# Patient Record
Sex: Male | Born: 1985 | Race: White | Hispanic: No | Marital: Married | Smoking: Never smoker
Health system: Southern US, Community
[De-identification: ages and names within clinical notes are randomized; demographics above are authoritative.]

## PROBLEM LIST (undated history)

## (undated) HISTORY — PX: ABDOMINAL SURGERY: SHX537

---

## 2017-06-30 ENCOUNTER — Other Ambulatory Visit: Payer: Self-pay

## 2017-06-30 ENCOUNTER — Encounter (HOSPITAL_COMMUNITY): Payer: Self-pay | Admitting: Emergency Medicine

## 2017-06-30 ENCOUNTER — Ambulatory Visit (HOSPITAL_COMMUNITY)
Admission: EM | Admit: 2017-06-30 | Discharge: 2017-06-30 | Disposition: A | Discharge status: Home / Self Care | Payer: 59 | Attending: Family Medicine | Admitting: Family Medicine

## 2017-06-30 DIAGNOSIS — Z202 Contact with and (suspected) exposure to infections with a predominantly sexual mode of transmission: Secondary | ICD-10-CM | POA: Diagnosis not present

## 2017-06-30 DIAGNOSIS — R369 Urethral discharge, unspecified: Secondary | ICD-10-CM | POA: Diagnosis not present

## 2017-06-30 LAB — POCT URINALYSIS DIP (DEVICE)
BILIRUBIN URINE: NEGATIVE
GLUCOSE, UA: NEGATIVE mg/dL
Hgb urine dipstick: NEGATIVE
Ketones, ur: NEGATIVE mg/dL
LEUKOCYTES UA: NEGATIVE
NITRITE: NEGATIVE
Protein, ur: NEGATIVE mg/dL
SPECIFIC GRAVITY, URINE: 1.025 (ref 1.005–1.030)
UROBILINOGEN UA: 0.2 mg/dL (ref 0.0–1.0)
pH: 6.5 (ref 5.0–8.0)

## 2017-06-30 MED ORDER — LIDOCAINE HCL (PF) 1 % IJ SOLN
INTRAMUSCULAR | Status: AC
  Filled 2017-06-30: qty 2

## 2017-06-30 MED ORDER — CEFTRIAXONE SODIUM 250 MG IJ SOLR
250.0000 mg | Freq: Once | INTRAMUSCULAR | Status: AC
  Administered 2017-06-30: 250 mg via INTRAMUSCULAR

## 2017-06-30 MED ORDER — AZITHROMYCIN 250 MG PO TABS
1000.0000 mg | ORAL_TABLET | Freq: Once | ORAL | Status: AC
  Administered 2017-06-30: 1000 mg via ORAL

## 2017-06-30 MED ORDER — CEFTRIAXONE SODIUM 250 MG IJ SOLR
INTRAMUSCULAR | Status: AC
  Filled 2017-06-30: qty 250

## 2017-06-30 MED ORDER — AZITHROMYCIN 250 MG PO TABS
ORAL_TABLET | ORAL | Status: AC
  Filled 2017-06-30: qty 4

## 2017-06-30 NOTE — ED Triage Notes (Addendum)
Treated 2 1/2 weeks ago treated for gc and chlamydia.  Continues to have discharge.  Patient was [place] [facility]   States he received an injection at the center he was treated at and has azithromycin sent to a pharmacy and he did take.  Continues to have pain with urination and discharge

## 2017-06-30 NOTE — ED Provider Notes (Signed)
  [facility]   [id number] 06/30/17 Arrival Time: 1720  ASSESSMENT & PLAN:  1. Penile discharge     Meds ordered this encounter  Medications  . cefTRIAXone (ROCEPHIN) injection 250 mg  . azithromycin (ZITHROMAX) tablet 1,000 mg   Retreat since he continues to have penile discharge. Urine cytology sent. Will notify of any positive results. Instructed to refrain from sexual activity for at least seven days.  Reviewed expectations re: course of current medical issues. Questions answered. Outlined signs and symptoms indicating need for more acute intervention. Patient verbalized understanding. After Visit Summary given.   SUBJECTIVE:  Albert Sanchez is a 32 y.o. male who presents with complaint of penile discharge. Reports being treated for GC/Chlamydia approx 2 weeks ago; tested + for GC. Reports he continues to see a "greenish discharge" in his underwear each morning. Urinary symptoms: occasional slight dysuria. Afebrile. No abdominal or pelvic pain. No n/v. No rashes or lesions. Sexually active with single male partner; wife. OTC treatment: None.  ROS: As per HPI.  OBJECTIVE:  Vitals:   06/30/17 1738  BP: 135/88  Pulse: 83  Resp: 18  Temp: 98.2 F (36.8 C)  TempSrc: Oral  SpO2: 96%     General appearance: alert, cooperative, appears stated age and no distress Throat: lips, mucosa, and tongue normal; teeth and gums normal Back: no CVA tenderness Abdomen: soft, non-tender; bowel sounds normal; no masses or organomegaly; no guarding or rebound tenderness GU: declines Skin: warm and dry Psychological:  Alert and cooperative. Normal mood and affect.  Results for orders placed or performed during the hospital encounter of 06/30/17  POCT urinalysis dip (device)  Result Value Ref Range   Glucose, UA NEGATIVE NEGATIVE mg/dL   Bilirubin Urine NEGATIVE NEGATIVE   Ketones, ur NEGATIVE NEGATIVE mg/dL   Specific Gravity, Urine 1.025 1.005 - 1.030   Hgb urine  dipstick NEGATIVE NEGATIVE   pH 6.5 5.0 - 8.0   Protein, ur NEGATIVE NEGATIVE mg/dL   Urobilinogen, UA 0.2 0.0 - 1.0 mg/dL   Nitrite NEGATIVE NEGATIVE   Leukocytes, UA NEGATIVE NEGATIVE    Labs Reviewed  POCT URINALYSIS DIP (DEVICE)  URINE CYTOLOGY ANCILLARY ONLY    No results found.  No Known Allergies   Family History  Problem Relation Age of Onset  . Healthy Mother    Social History   Socioeconomic History  . Marital status: Married    Spouse name: Not on file  . Number of children: Not on file  . Years of education: Not on file  . Highest education level: Not on file  Social Needs  . Financial resource strain: Not on file  . Food insecurity - worry: Not on file  . Food insecurity - inability: Not on file  . Transportation needs - medical: Not on file  . Transportation needs - non-medical: Not on file  Occupational History  . Not on file  Tobacco Use  . Smoking status: Never Smoker  Substance and Sexual Activity  . Alcohol use: Yes  . Drug use: No  . Sexual activity: Not on file  Other Topics Concern  . Not on file  Social History Narrative  . Not on file          Mardella LaymanHagler, Noriko Macari, MD 06/30/17 1820

## 2017-06-30 NOTE — Discharge Instructions (Signed)

## 2017-06-30 NOTE — ED Notes (Signed)
Call back number verified and updated in EPIC... Adv pt to not have SI until lab results comeback neg.... Also adv pt lab results will be on MyChart; instructions given .... Pt verb understanding.   

## 2017-07-01 LAB — URINE CYTOLOGY ANCILLARY ONLY
CHLAMYDIA, DNA PROBE: NEGATIVE
Neisseria Gonorrhea: NEGATIVE
Trichomonas: NEGATIVE

## 2020-04-23 ENCOUNTER — Other Ambulatory Visit: Payer: Self-pay | Admitting: Nurse Practitioner

## 2020-04-23 ENCOUNTER — Ambulatory Visit
Admission: RE | Admit: 2020-04-23 | Discharge: 2020-04-23 | Disposition: A | Discharge status: Home / Self Care | Payer: No Typology Code available for payment source | Source: Ambulatory Visit | Attending: Nurse Practitioner | Admitting: Nurse Practitioner

## 2020-04-23 DIAGNOSIS — Z021 Encounter for pre-employment examination: Secondary | ICD-10-CM

## 2020-06-17 ENCOUNTER — Inpatient Hospital Stay (HOSPITAL_COMMUNITY)
Admission: EM | Admit: 2020-06-17 | Discharge: 2020-06-18 | DRG: 390 | Disposition: A | Discharge status: Home / Self Care | Payer: 59 | Attending: General Surgery | Admitting: General Surgery

## 2020-06-17 ENCOUNTER — Emergency Department (HOSPITAL_COMMUNITY): Payer: 59

## 2020-06-17 ENCOUNTER — Encounter (HOSPITAL_COMMUNITY): Payer: Self-pay | Admitting: Emergency Medicine

## 2020-06-17 DIAGNOSIS — Z20822 Contact with and (suspected) exposure to covid-19: Secondary | ICD-10-CM | POA: Diagnosis present

## 2020-06-17 DIAGNOSIS — K56609 Unspecified intestinal obstruction, unspecified as to partial versus complete obstruction: Secondary | ICD-10-CM | POA: Diagnosis not present

## 2020-06-17 DIAGNOSIS — Z79899 Other long term (current) drug therapy: Secondary | ICD-10-CM

## 2020-06-17 DIAGNOSIS — G44009 Cluster headache syndrome, unspecified, not intractable: Secondary | ICD-10-CM | POA: Diagnosis present

## 2020-06-17 LAB — COMPREHENSIVE METABOLIC PANEL
ALT: 42 U/L (ref 0–44)
AST: 53 U/L — ABNORMAL HIGH (ref 15–41)
Albumin: 4.2 g/dL (ref 3.5–5.0)
Alkaline Phosphatase: 50 U/L (ref 38–126)
Anion gap: 13 (ref 5–15)
BUN: 19 mg/dL (ref 6–20)
CO2: 24 mmol/L (ref 22–32)
Calcium: 9.6 mg/dL (ref 8.9–10.3)
Chloride: 102 mmol/L (ref 98–111)
Creatinine, Ser: 1.09 mg/dL (ref 0.61–1.24)
GFR, Estimated: >60 mL/min (ref >60)
Glucose, Bld: 95 mg/dL (ref 70–99)
Potassium: 3.9 mmol/L (ref 3.5–5.1)
Sodium: 139 mmol/L (ref 135–145)
Total Bilirubin: 0.9 mg/dL (ref 0.3–1.2)
Total Protein: 7 g/dL (ref 6.5–8.1)

## 2020-06-17 LAB — CBC WITH DIFFERENTIAL/PLATELET
Abs Immature Granulocytes: 0.06 10*3/uL (ref 0.00–0.07)
Basophils Absolute: 0.1 10*3/uL (ref 0.0–0.1)
Basophils Relative: 0 %
Eosinophils Absolute: 0.1 10*3/uL (ref 0.0–0.5)
Eosinophils Relative: 1 %
HCT: 42.9 % (ref 39.0–52.0)
Hemoglobin: 14.9 g/dL (ref 13.0–17.0)
Immature Granulocytes: 1 %
Lymphocytes Relative: 13 %
Lymphs Abs: 1.8 10*3/uL (ref 0.7–4.0)
MCH: 31.2 pg (ref 26.0–34.0)
MCHC: 34.7 g/dL (ref 30.0–36.0)
MCV: 89.9 fL (ref 80.0–100.0)
Monocytes Absolute: 1.1 10*3/uL — ABNORMAL HIGH (ref 0.1–1.0)
Monocytes Relative: 8 %
Neutro Abs: 10.2 10*3/uL — ABNORMAL HIGH (ref 1.7–7.7)
Neutrophils Relative %: 77 %
Platelets: 326 10*3/uL (ref 150–400)
RBC: 4.77 MIL/uL (ref 4.22–5.81)
RDW: 11.9 % (ref 11.5–15.5)
WBC: 13.1 10*3/uL — ABNORMAL HIGH (ref 4.0–10.5)
nRBC: 0 % (ref 0.0–0.2)

## 2020-06-17 LAB — CBG MONITORING, ED: Glucose-Capillary: 92 mg/dL (ref 70–99)

## 2020-06-17 LAB — LIPASE, BLOOD: Lipase: 38 U/L (ref 11–51)

## 2020-06-17 LAB — LACTIC ACID, PLASMA: Lactic Acid, Venous: 1.1 mmol/L (ref 0.5–1.9)

## 2020-06-17 MED ORDER — ONDANSETRON HCL 4 MG/2ML IJ SOLN
4.0000 mg | Freq: Once | INTRAMUSCULAR | Status: AC
  Administered 2020-06-17: 4 mg via INTRAVENOUS
  Filled 2020-06-17: qty 2

## 2020-06-17 MED ORDER — MORPHINE SULFATE (PF) 4 MG/ML IV SOLN
4.0000 mg | Freq: Once | INTRAVENOUS | Status: AC
  Administered 2020-06-17: 4 mg via INTRAVENOUS
  Filled 2020-06-17: qty 1

## 2020-06-17 MED ORDER — DOCUSATE SODIUM 100 MG PO CAPS
100.0000 mg | ORAL_CAPSULE | Freq: Two times a day (BID) | ORAL | Status: DC
  Administered 2020-06-18: 100 mg via ORAL
  Filled 2020-06-17: qty 1

## 2020-06-17 MED ORDER — ACETAMINOPHEN 10 MG/ML IV SOLN
1000.0000 mg | Freq: Four times a day (QID) | INTRAVENOUS | Status: DC
  Administered 2020-06-17 – 2020-06-18 (×3): 1000 mg via INTRAVENOUS
  Filled 2020-06-17 (×5): qty 100

## 2020-06-17 MED ORDER — LACTATED RINGERS IV SOLN: INTRAVENOUS | Status: DC

## 2020-06-17 MED ORDER — ONDANSETRON 4 MG PO TBDP: 4.0000 mg | ORAL_TABLET | Freq: Four times a day (QID) | ORAL | Status: DC | PRN

## 2020-06-17 MED ORDER — KETOROLAC TROMETHAMINE 15 MG/ML IJ SOLN
15.0000 mg | Freq: Four times a day (QID) | INTRAMUSCULAR | Status: DC
  Administered 2020-06-17 – 2020-06-18 (×4): 15 mg via INTRAVENOUS
  Filled 2020-06-17 (×4): qty 1

## 2020-06-17 MED ORDER — METHOCARBAMOL 1000 MG/10ML IJ SOLN
1000.0000 mg | Freq: Three times a day (TID) | INTRAVENOUS | Status: DC
  Administered 2020-06-17 – 2020-06-18 (×2): 1000 mg via INTRAVENOUS
  Filled 2020-06-17 (×2): qty 10

## 2020-06-17 MED ORDER — ENOXAPARIN SODIUM 40 MG/0.4ML ~~LOC~~ SOLN: 40.0000 mg | SUBCUTANEOUS | Status: DC

## 2020-06-17 MED ORDER — FENTANYL CITRATE (PF) 100 MCG/2ML IJ SOLN
50.0000 ug | Freq: Once | INTRAMUSCULAR | Status: AC
  Administered 2020-06-17: 50 ug via INTRAVENOUS
  Filled 2020-06-17: qty 2

## 2020-06-17 MED ORDER — OXYCODONE HCL 5 MG/5ML PO SOLN
5.0000 mg | ORAL | Status: DC | PRN
Start: 2020-06-17 — End: 2020-06-18

## 2020-06-17 MED ORDER — MORPHINE SULFATE (PF) 2 MG/ML IV SOLN
2.0000 mg | INTRAVENOUS | Status: DC | PRN
Start: 2020-06-17 — End: 2020-06-18

## 2020-06-17 MED ORDER — DIATRIZOATE MEGLUMINE & SODIUM 66-10 % PO SOLN
90.0000 mL | Freq: Once | ORAL | Status: AC
  Administered 2020-06-17: 90 mL via NASOGASTRIC
  Filled 2020-06-17 (×2): qty 90

## 2020-06-17 MED ORDER — IOHEXOL 300 MG/ML  SOLN
100.0000 mL | Freq: Once | INTRAMUSCULAR | Status: AC | PRN
  Administered 2020-06-17: 100 mL via INTRAVENOUS

## 2020-06-17 MED ORDER — ONDANSETRON HCL 4 MG/2ML IJ SOLN: 4.0000 mg | Freq: Four times a day (QID) | INTRAMUSCULAR | Status: DC | PRN
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
## 2020-06-17 NOTE — ED Triage Notes (Signed)
Pt here with c/o abd pain along with n/v , pt has hx of abd trauma and has been told he had some adhesions in his abd , pt also had covid about 2 weeks ago
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
## 2020-06-18 ENCOUNTER — Inpatient Hospital Stay (HOSPITAL_COMMUNITY): Payer: 59

## 2020-06-18 ENCOUNTER — Other Ambulatory Visit: Payer: Self-pay

## 2020-06-18 ENCOUNTER — Encounter (HOSPITAL_COMMUNITY): Payer: Self-pay | Admitting: *Deleted

## 2020-06-18 LAB — BASIC METABOLIC PANEL
Anion gap: 12 (ref 5–15)
BUN: 22 mg/dL — ABNORMAL HIGH (ref 6–20)
CO2: 24 mmol/L (ref 22–32)
Calcium: 9 mg/dL (ref 8.9–10.3)
Chloride: 104 mmol/L (ref 98–111)
Creatinine, Ser: 1.03 mg/dL (ref 0.61–1.24)
GFR, Estimated: >60 mL/min (ref >60)
Glucose, Bld: 90 mg/dL (ref 70–99)
Potassium: 3.4 mmol/L — ABNORMAL LOW (ref 3.5–5.1)
Sodium: 140 mmol/L (ref 135–145)

## 2020-06-18 LAB — CBC
HCT: 40.6 % (ref 39.0–52.0)
Hemoglobin: 14.7 g/dL (ref 13.0–17.0)
MCH: 32.2 pg (ref 26.0–34.0)
MCHC: 36.2 g/dL — ABNORMAL HIGH (ref 30.0–36.0)
MCV: 88.8 fL (ref 80.0–100.0)
Platelets: 275 10*3/uL (ref 150–400)
RBC: 4.57 MIL/uL (ref 4.22–5.81)
RDW: 11.9 % (ref 11.5–15.5)
WBC: 9.3 10*3/uL (ref 4.0–10.5)
nRBC: 0 % (ref 0.0–0.2)

## 2020-06-18 LAB — URINALYSIS, ROUTINE W REFLEX MICROSCOPIC
Bacteria, UA: NONE SEEN
Glucose, UA: NEGATIVE mg/dL
Hgb urine dipstick: NEGATIVE
Ketones, ur: 80 mg/dL — AB
Leukocytes,Ua: NEGATIVE
Nitrite: NEGATIVE
Protein, ur: 30 mg/dL — AB
Specific Gravity, Urine: >1.046 — ABNORMAL HIGH (ref 1.005–1.030)
pH: 5 (ref 5.0–8.0)

## 2020-06-18 LAB — HIV ANTIBODY (ROUTINE TESTING W REFLEX): HIV Screen 4th Generation wRfx: NONREACTIVE

## 2020-06-18 MED ORDER — ACETAMINOPHEN 500 MG PO TABS
1000.0000 mg | ORAL_TABLET | Freq: Four times a day (QID) | ORAL | Status: DC
  Administered 2020-06-18: 1000 mg via ORAL
  Filled 2020-06-18: qty 2

## 2020-06-18 MED ORDER — IBUPROFEN 200 MG PO TABS: 200.0000 mg | ORAL_TABLET | Freq: Three times a day (TID) | ORAL | 2 refills | Status: AC | PRN

## 2020-06-18 MED ORDER — METHOCARBAMOL 500 MG PO TABS: 500.0000 mg | ORAL_TABLET | Freq: Four times a day (QID) | ORAL | Status: DC | PRN

## 2020-06-18 MED ORDER — ACETAMINOPHEN 500 MG PO TABS: 1000.0000 mg | ORAL_TABLET | Freq: Four times a day (QID) | ORAL | 0 refills | Status: AC | PRN

## 2020-06-18 MED ORDER — SUMATRIPTAN SUCCINATE 6 MG/0.5ML ~~LOC~~ SOLN
6.0000 mg | Freq: Once | SUBCUTANEOUS | Status: AC
  Administered 2020-06-18: 6 mg via SUBCUTANEOUS
  Filled 2020-06-18: qty 0.5

## 2020-06-18 NOTE — Progress Notes (Signed)
Pt ready for DC after eating his soft diet. Work note provided by Barnetta Chapel, PA.  DC instructions reviewed and given.

## 2020-06-18 NOTE — Progress Notes (Signed)
Pt tolerating clear liquids, advanced to fulls.

## 2020-06-18 NOTE — Progress Notes (Signed)
Subjective: Still with some abdominal pain, but passing gas and having BMs.  No nausea.    ROS: See above, otherwise other systems negative  Objective: Vital signs in last 24 hours: Temp:  [97.7 F (36.5 C)-98.7 F (37.1 C)] 98.1 F (36.7 C) (01/11 0809) Pulse Rate:  [57-98] 57 (01/11 0809) Resp:  [14-18] 18 (01/11 0809) BP: (93-128)/(66-89) 112/66 (01/11 0809) SpO2:  [96 %-98 %] 96 % (01/11 0809) Weight:  [90.7 kg] 90.7 kg (01/10 1034) Last BM Date: 06/18/20  Intake/Output from previous day: 01/10 0701 - 01/11 0700 In: 1379.6 [I.V.:979.8; IV Piggyback:399.8] Out: 1 [Urine:1] Intake/Output this shift: No intake/output data recorded.  PE: Heart: regular Lungs: CTAB Abd: soft, mildly tender in lower abdomen, +BS, ND  Lab Results:  Recent Labs    06/17/20 1059 06/18/20 0459  WBC 13.1* 9.3  HGB 14.9 14.7  HCT 42.9 40.6  PLT 326 275   BMET Recent Labs    06/17/20 1059 06/18/20 0459  NA 139 140  K 3.9 3.4*  CL 102 104  CO2 24 24  GLUCOSE 95 90  BUN 19 22*  CREATININE 1.09 1.03  CALCIUM 9.6 9.0   PT/INR No results for input(s): LABPROT, INR in the last 72 hours. CMP     Component Value Date/Time   NA 140 06/18/2020 0459   K 3.4 (L) 06/18/2020 0459   CL 104 06/18/2020 0459   CO2 24 06/18/2020 0459   GLUCOSE 90 06/18/2020 0459   BUN 22 (H) 06/18/2020 0459   CREATININE 1.03 06/18/2020 0459   CALCIUM 9.0 06/18/2020 0459   PROT 7.0 06/17/2020 1059   ALBUMIN 4.2 06/17/2020 1059   AST 53 (H) 06/17/2020 1059   ALT 42 06/17/2020 1059   ALKPHOS 50 06/17/2020 1059   BILITOT 0.9 06/17/2020 1059   GFRNONAA >60 06/18/2020 0459   Lipase     Component Value Date/Time   LIPASE 38 06/17/2020 1059       Studies/Results: CT ABDOMEN PELVIS W CONTRAST  Result Date: 06/17/2020 CLINICAL DATA:  Abdominal pain with nausea and vomiting. Prior bowel resection 6 years ago previous adhesions. EXAM: CT ABDOMEN AND PELVIS WITH CONTRAST TECHNIQUE:  Multidetector CT imaging of the abdomen and pelvis was performed using the standard protocol following bolus administration of intravenous contrast. CONTRAST:  OMNIPAQUE IOHEXOL 300 MG/ML  SOLN COMPARISON:  06/08/2019 FINDINGS: Lower chest: Unremarkable Hepatobiliary: Probable hepatic steatosis. Gallbladder unremarkable. No biliary dilatation. No significant focal hepatic lesions. Pancreas: Unremarkable Spleen: Unremarkable Adrenals/Urinary Tract: Both adrenal glands appear normal. 4 mm in long axis left kidney upper pole nonobstructive renal calculus. Two nonobstructive right renal calculi measure up to 0.2 cm in diameter. No ureteral or bladder calculus identified. No hydronephrosis or hydroureter. Stomach/Bowel: Borderline dilated central loops of small bowel measuring up to 3.0 cm in diameter, but without a well-defined zone of transition, and only slow tapering into nondilated loops of distal small bowel. No obvious twisted loop or well-defined focal adhesion as a lead point for obstruction. Normal appendix.  No dilation of the large bowel. Vascular/Lymphatic: Unremarkable Reproductive: Unremarkable Other: No supplemental non-categorized findings. Musculoskeletal: Unremarkable IMPRESSION: 1. Borderline dilated central loops of small bowel measuring up to 3.0 cm in diameter, but without a well-defined zone of transition, and only slow tapering into nondilated loops of distal small bowel. No obvious twisted loop or well-defined focal adhesion as a lead point for obstruction. Appearance could well be due to a localized small bowel ileus, correlate  with bowel sounds. If symptoms persist or worsen then follow up imaging may be warranted. 2. Bilateral nonobstructive nephrolithiasis. 3. Probable hepatic steatosis. Electronically Signed   By: Gaylyn Rong M.D.   On: 06/17/2020 17:22   DG Abd Portable 1V-Small Bowel Obstruction Protocol-initial, 8 hr delay  Result Date: 06/18/2020 CLINICAL DATA:   Small-bowel protocol, 8 hour delay EXAM: PORTABLE ABDOMEN - 1 VIEW COMPARISON:  CT 06/17/2020 FINDINGS: Administered high attenuation enteric contrast media has traversed to the level of the rectal vault. No residual distended loops of small bowel are visualized in the abdomen. Some water diluted contrast media projects over the abdomen centrally. No suspicious calcifications. No acute osseous or other soft tissue abnormality. IMPRESSION: 1. Administered high attenuation enteric contrast media has traversed to the level of the rectal vault. 2. No residual distended loops of small bowel. Electronically Signed   By: Kreg Shropshire M.D.   On: 06/18/2020 05:18    Anti-infectives: Anti-infectives (From admission, onward)   None       Assessment/Plan SBO -resolving on films and clinically.  Still with some abdominal pain, which can be expected in resolving SBOs -CLD, ADAT -anticipate DC home tomorrow   FEN - CLD, ADAT, AVFs VTE - Lovenox ID - none   LOS: 1 day    Letha Cape , [facility] 06/18/2020, 10:19 AM Please see Amion for pager number during day hours 7:00am-4:30pm or 7:00am -11:30am on weekends

## 2020-06-18 NOTE — Discharge Instructions (Signed)
Bowel Obstruction A bowel obstruction means that something is blocking the small or large bowel. The bowel is also called the intestine. It is the long tube that connects the stomach to the opening of the butt (anus). When something blocks the bowel, food and fluids cannot pass through like normal. This condition needs to be treated. Treatment depends on the cause of the problem and how bad the problem is. What are the causes? Common causes of this condition include:  Scar tissue (adhesions) from past surgery or from high-energy X-rays (radiation).  Recent surgery in the belly. This affects how food moves in the bowel.  Some diseases, such as: ? Irritation of the lining of the digestive tract (Crohn's disease). ? Irritation of small pouches in the bowel (diverticulitis).  Growths or tumors.  A bulging organ (hernia).  Twisting of the bowel (volvulus).  A foreign body.  Slipping of a part of the bowel into another part (intussusception).   What are the signs or symptoms? Symptoms of this condition include:  Pain in the belly.  Feeling sick to your stomach (nauseous).  Throwing up (vomiting).  Bloating in the belly.  Being unable to pass gas.  Trouble pooping (constipation).  Watery poop (diarrhea).  A lot of belching. How is this diagnosed? This condition may be diagnosed based on:  A physical exam.  Medical history.  Imaging tests, such as X-ray or CT scan.  Blood tests.  Urine tests. How is this treated? Treatment for this condition may include:  Fluids and pain medicines that are given through an IV tube. Your doctor may tell you not to eat or drink if you feel sick to your stomach and are throwing up.  Eating a clear liquid diet for a few days.  Putting a small tube (nasogastric tube) into the stomach. This will help with pain, discomfort, and nausea by removing blocked air and fluids from the stomach.  Surgery. This may be needed if other treatments do  not work. Follow these instructions at home: Medicines  Take over-the-counter and prescription medicines only as told by your doctor.  If you were prescribed an antibiotic medicine, take it as told by your doctor. Do not stop taking the antibiotic even if you start to feel better. General instructions  Follow your diet as told by your doctor. You may need to: ? Only drink clear liquids until you start to get better. ? Avoid solid foods.  Return to your normal activities as told by your doctor. Ask your doctor what activities are safe for you.  Do not sit for a long time without moving. Get up to take short walks every 1-2 hours. This is important. Ask for help if you feel weak or unsteady.  Keep all follow-up visits as told by your doctor. This is important. How is this prevented? After having a bowel obstruction, you may be more likely to have another. You can do some things to stop it from happening again.  If you have a long-term (chronic) disease, contact your doctor if you see changes or problems.  Take steps to prevent or treat trouble pooping. Your doctor may ask that you: ? Drink enough fluid to keep your pee (urine) pale yellow. ? Take over-the-counter or prescription medicines. ? Eat foods that are high in fiber. These include beans, whole grains, and fresh fruits and vegetables. ? Limit foods that are high in fat and sugar. These include fried or sweet foods.  Stay active. Ask your doctor which   exercises are safe for you.  Avoid stress.  Eat three small meals and three small snacks each day.  Work with a food expert (dietitian) to make a meal plan that works for you.  Do not use any products that contain nicotine or tobacco, such as cigarettes and e-cigarettes. If you need help quitting, ask your doctor.   Contact a doctor if:  You have a fever.  You have chills. Get help right away if:  You have pain or cramps that get worse.  You throw up blood.  You are  sick to your stomach.  You cannot stop throwing up.  You cannot drink fluids.  You feel mixed up (confused).  You feel very thirsty (dehydrated).  Your belly gets more bloated.  You feel weak or you pass out (faint). Summary  A bowel obstruction means that something is blocking the small or large bowel.  Treatment may include IV fluids and pain medicine. You may also have a clear liquid diet, a small tube in your stomach, or surgery.  Drink clear liquids and avoid solid foods until you get better. This information is not intended to replace advice given to you by your health care provider. Make sure you discuss any questions you have with your health care provider. Document Revised: 10/06/2017 Document Reviewed: 10/06/2017 Elsevier Patient Education  2021 [employer]  

## 2020-06-18 NOTE — Progress Notes (Signed)
Pt reports relief from Imitrex shot earlier this am.
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
## 2022-04-19 IMAGING — CT CT ABD-PELV W/ CM
2 of 4 series · 16 of 46 positions shown, 18 images · IV contrast (APPLIED)
Comparison: 06/08/2019

CLINICAL DATA: Abdominal pain with nausea and vomiting. Prior bowel
resection 6 years ago previous adhesions.

EXAM:
CT ABDOMEN AND PELVIS WITH CONTRAST
TECHNIQUE: Multidetector CT imaging of the abdomen and pelvis was performed
using the standard protocol following bolus administration of
intravenous contrast.
CONTRAST:  100mL OMNIPAQUE IOHEXOL 300 MG/ML  SOLN

[Series 3: abd/ pelvis 5.0 i30f 2 · axial · 0.83mm/px · z∈[+696,+1176]mm · 13 of 106 slices shown, 15 images]
[im 5/106  soft-tissue]
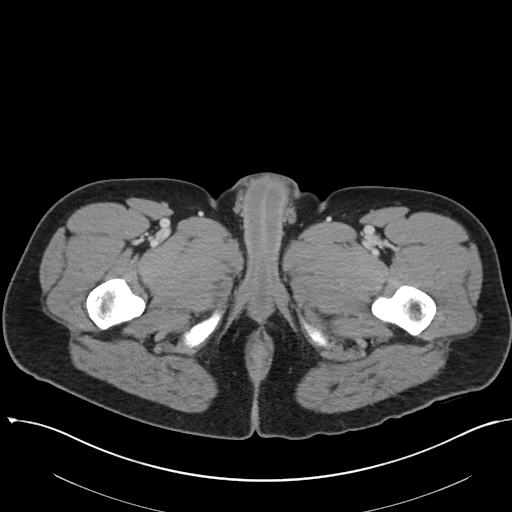
[im 5/106  bone]
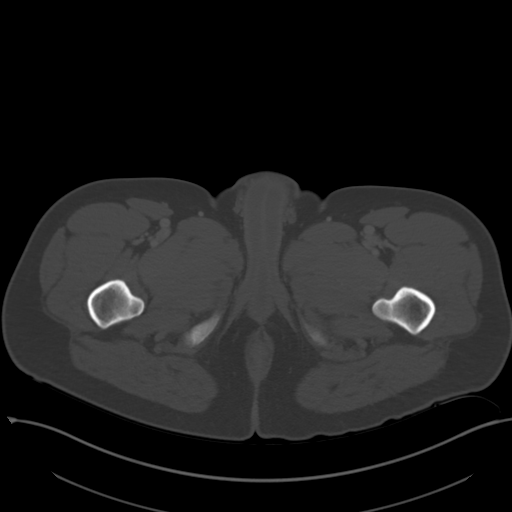
[im 13/106  soft-tissue]
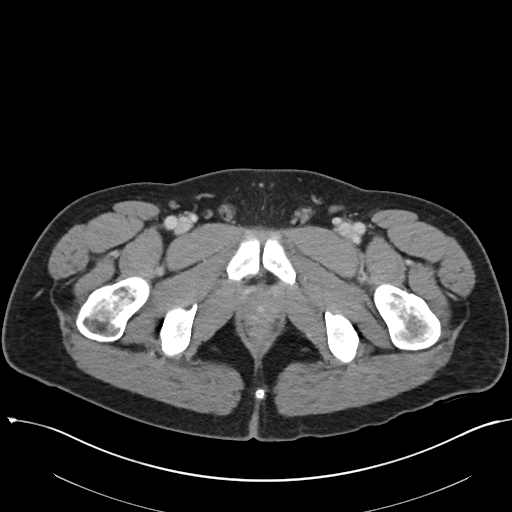
[im 22/106  soft-tissue]
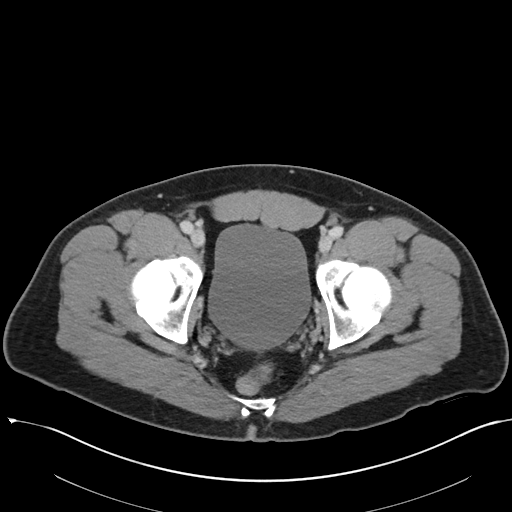
[im 30/106  soft-tissue]
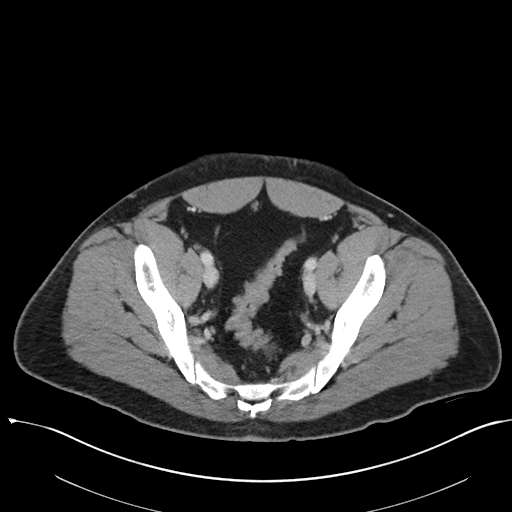
[im 38/106  soft-tissue]
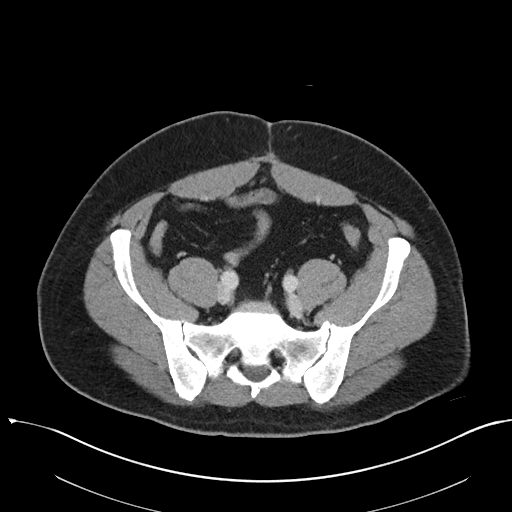
[im 47/106  soft-tissue]
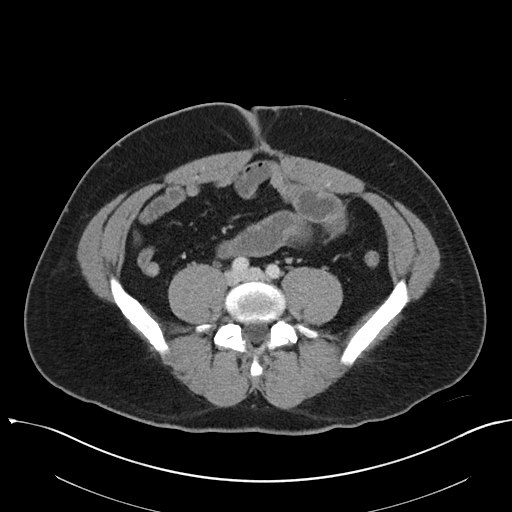
[im 55/106  soft-tissue]
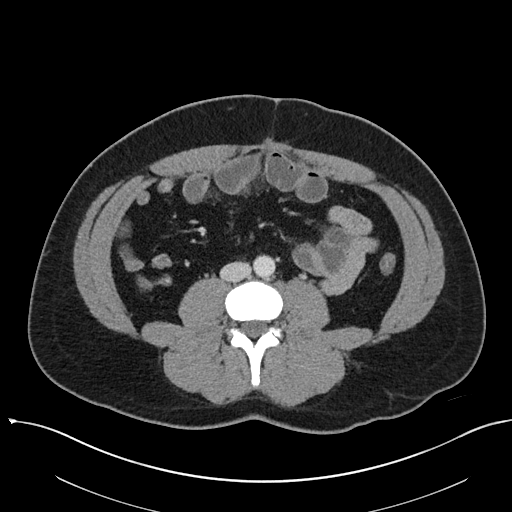
[im 59/106  soft-tissue]
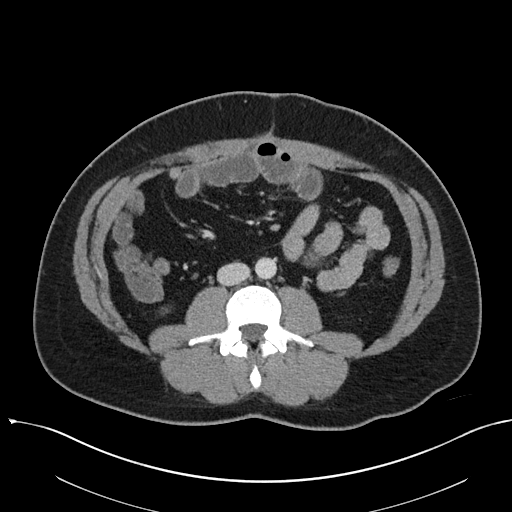
[im 68/106  soft-tissue]
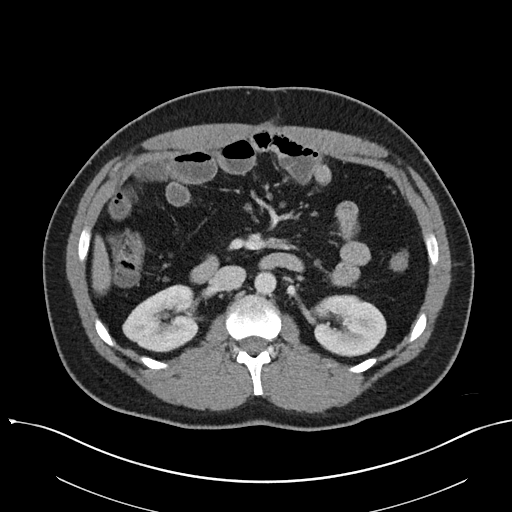
[im 68/106  bone]
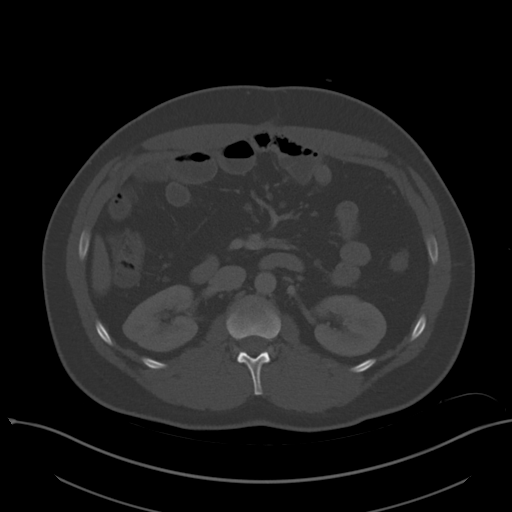
[im 76/106  soft-tissue]
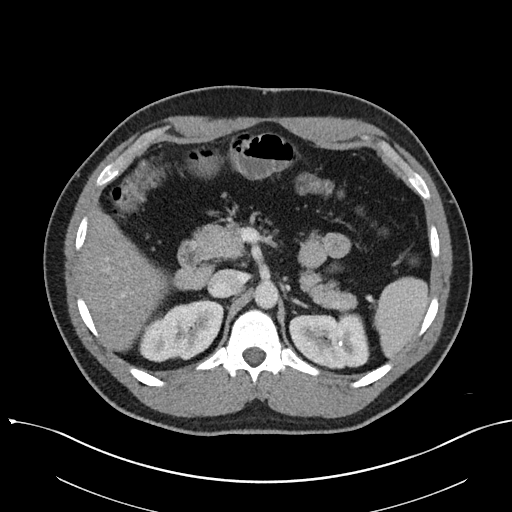
[im 85/106  soft-tissue]
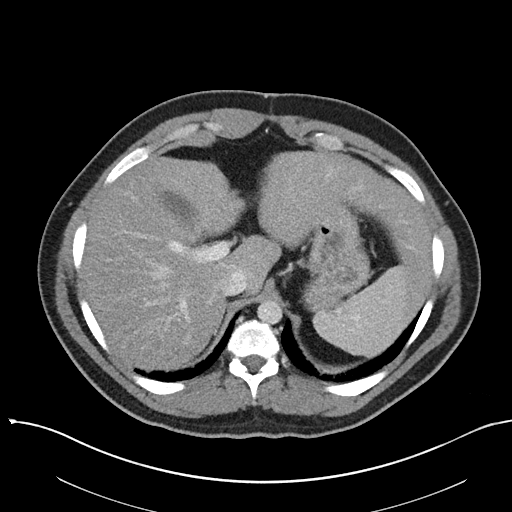
[im 93/106  soft-tissue]
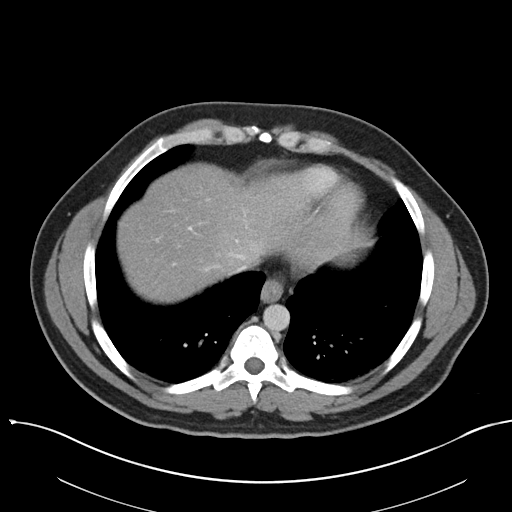
[im 101/106  soft-tissue]
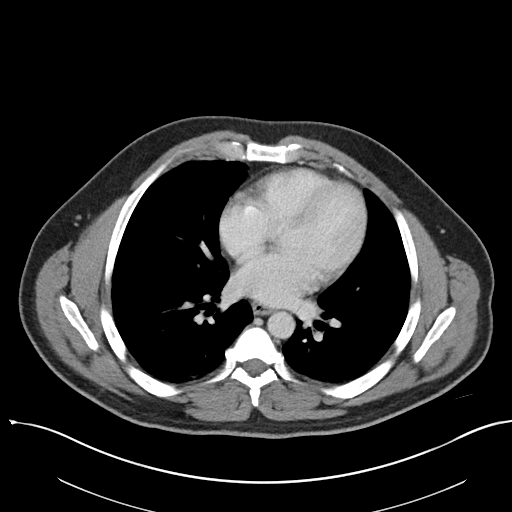

[Series 6: coronal soft tissue · coronal · 0.88mm/px · 3 of 100 slices shown]
[im 34/100  soft-tissue]
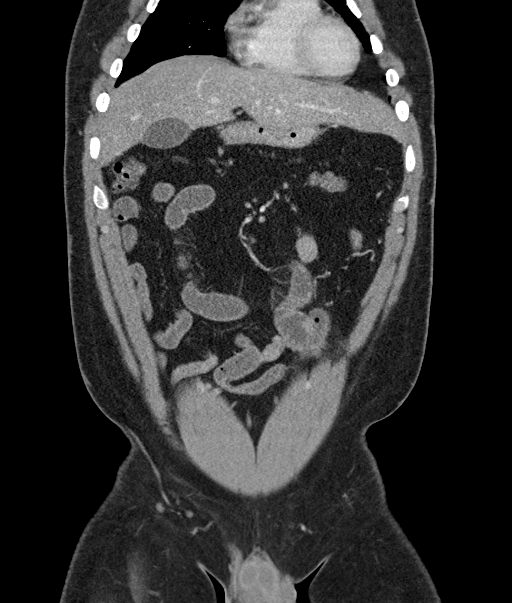
[im 45/100  soft-tissue]
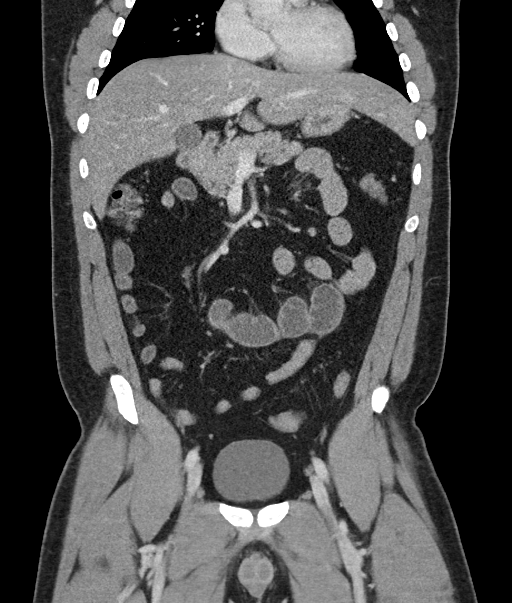
[im 56/100  soft-tissue]
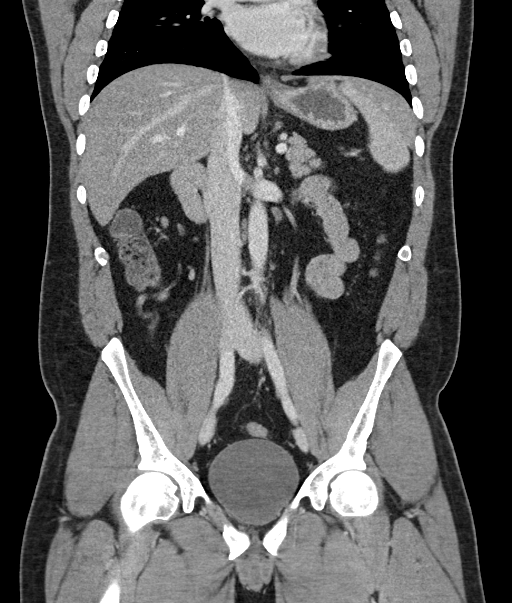

[16 of 46 positions shown; findings below may reference images not displayed]

FINDINGS: Lower chest: Unremarkable

Hepatobiliary: Probable hepatic steatosis. Gallbladder unremarkable.
No biliary dilatation. No significant focal hepatic lesions.

Pancreas: Unremarkable

Spleen: Unremarkable

Adrenals/Urinary Tract: Both adrenal glands appear normal. 4 mm in
long axis left kidney upper pole nonobstructive renal calculus. Two
nonobstructive right renal calculi measure up to 0.2 cm in diameter.
No ureteral or bladder calculus identified. No hydronephrosis or
hydroureter.

Stomach/Bowel: Borderline dilated central loops of small bowel
measuring up to 3.0 cm in diameter, but without a well-defined zone
of transition, and only slow tapering into nondilated loops of
distal small bowel. No obvious twisted loop or well-defined focal
adhesion as a lead point for obstruction.

Normal appendix.  No dilation of the large bowel.

Vascular/Lymphatic: Unremarkable

Reproductive: Unremarkable

Other: No supplemental non-categorized findings.

Musculoskeletal: Unremarkable
IMPRESSION: 1. Borderline dilated central loops of small bowel measuring up to
3.0 cm in diameter, but without a well-defined zone of transition,
and only slow tapering into nondilated loops of distal small bowel.
No obvious twisted loop or well-defined focal adhesion as a lead
point for obstruction. Appearance could well be due to a localized
small bowel ileus, correlate with bowel sounds. If symptoms persist
or worsen then follow up imaging may be warranted.
2. Bilateral nonobstructive nephrolithiasis.
3. Probable hepatic steatosis.

## 2022-04-20 IMAGING — DX DG ABD PORTABLE 1V
2 series · 2 of 2 positions shown · non-contrast
Comparison: CT 06/17/2020

CLINICAL DATA: Small-bowel protocol, 8 hour delay

EXAM:
PORTABLE ABDOMEN - 1 VIEW

[abdomen supine (1 of 2)]
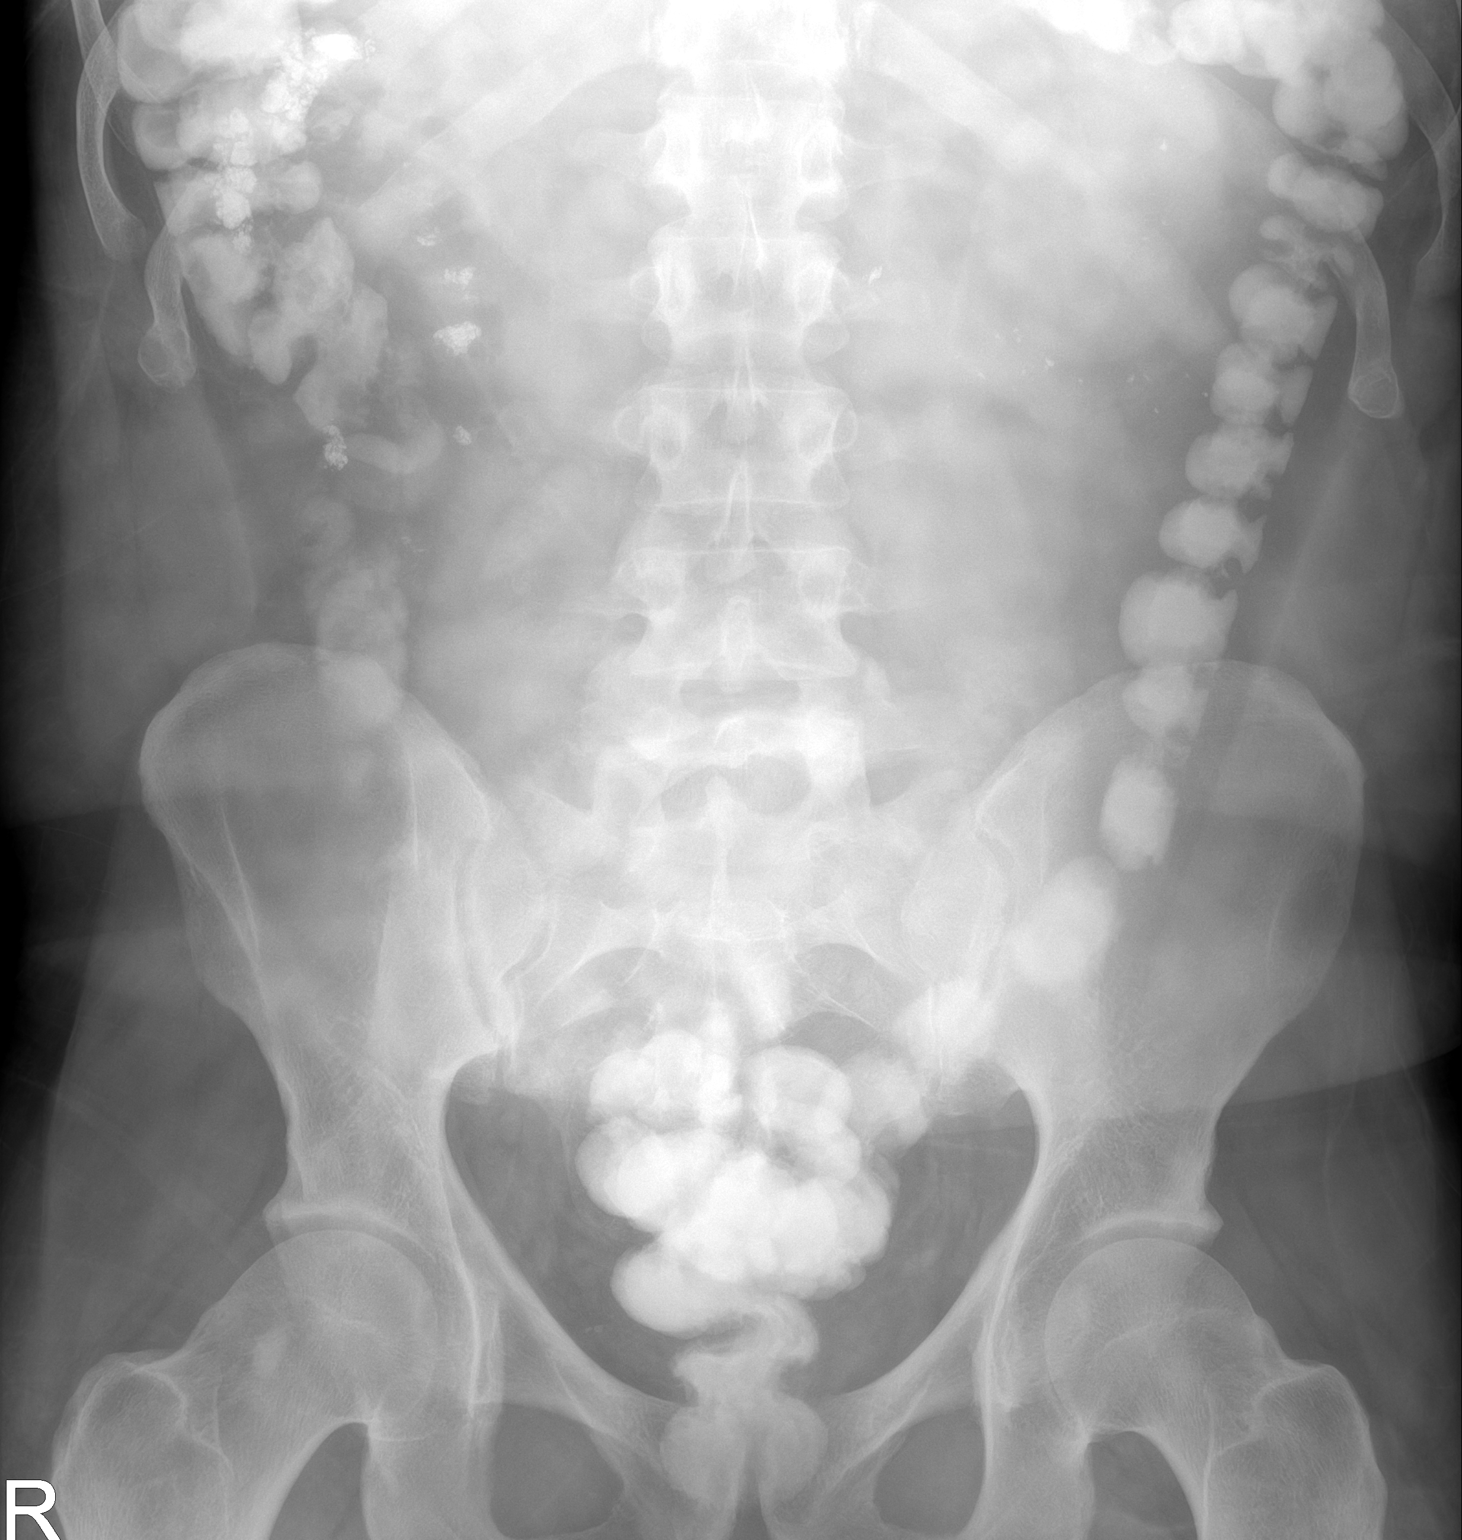

[abdomen supine (2 of 2)]
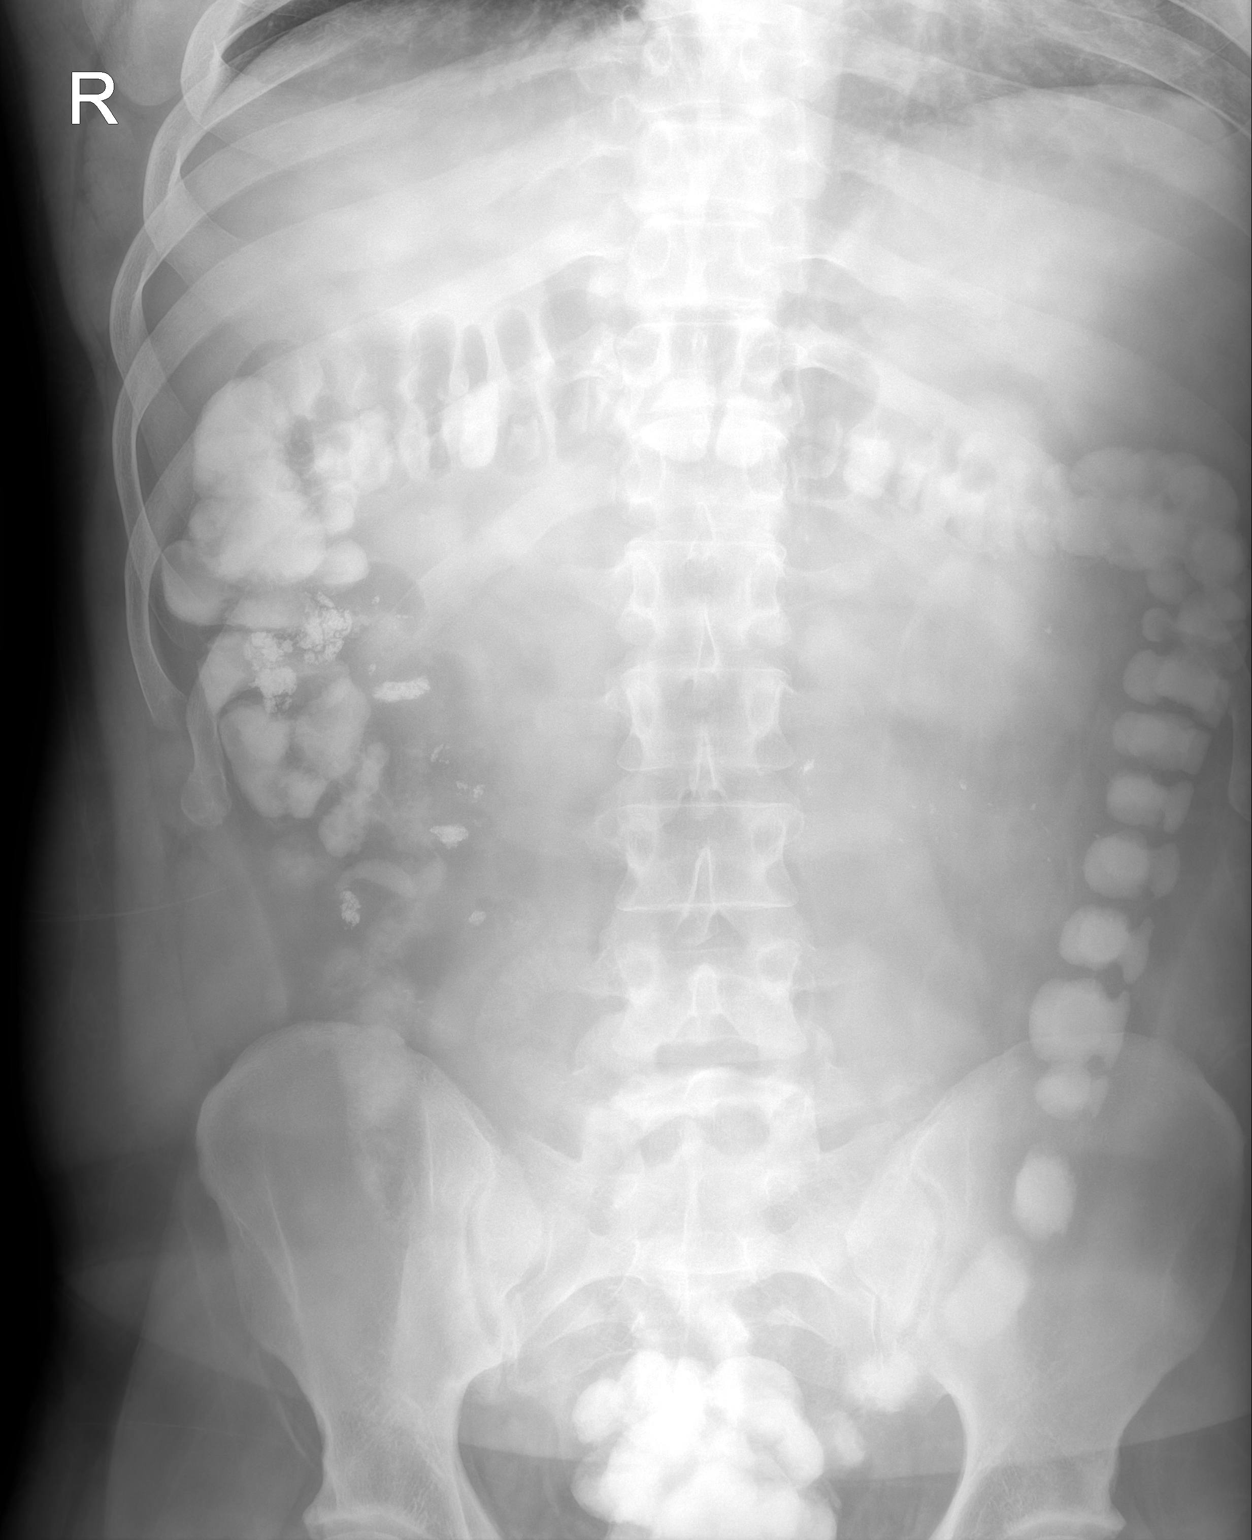

[2 of 2 positions shown; findings below may reference images not displayed]

FINDINGS: Administered high attenuation enteric contrast media has traversed
to the level of the rectal vault. No residual distended loops of
small bowel are visualized in the abdomen. Some water diluted
contrast media projects over the abdomen centrally. No suspicious
calcifications. No acute osseous or other soft tissue abnormality.
IMPRESSION: 1. Administered high attenuation enteric contrast media has
traversed to the level of the rectal vault.
2. No residual distended loops of small bowel.

## 2024-07-05 ENCOUNTER — Other Ambulatory Visit (HOSPITAL_BASED_OUTPATIENT_CLINIC_OR_DEPARTMENT_OTHER): Payer: Self-pay | Admitting: Family Medicine

## 2024-07-05 DIAGNOSIS — Z8249 Family history of ischemic heart disease and other diseases of the circulatory system: Secondary | ICD-10-CM

## 2024-07-25 ENCOUNTER — Other Ambulatory Visit (HOSPITAL_BASED_OUTPATIENT_CLINIC_OR_DEPARTMENT_OTHER)
